# Patient Record
Sex: Male | Born: 1990 | Race: White | Hispanic: No | Marital: Single | Smoking: Current every day smoker
Health system: Southern US, Community
[De-identification: ages and names within clinical notes are randomized; demographics above are authoritative.]

## PROBLEM LIST (undated history)

## (undated) DIAGNOSIS — L409 Psoriasis, unspecified: Secondary | ICD-10-CM

## (undated) DIAGNOSIS — B019 Varicella without complication: Secondary | ICD-10-CM

## (undated) HISTORY — DX: Varicella without complication: B01.9

## (undated) HISTORY — PX: WISDOM TOOTH EXTRACTION: SHX21

## (undated) HISTORY — DX: Psoriasis, unspecified: L40.9

---
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
## 2019-05-27 ENCOUNTER — Other Ambulatory Visit: Payer: Self-pay

## 2019-05-28 ENCOUNTER — Ambulatory Visit: Payer: Self-pay | Admitting: Family Medicine

## 2019-05-28 ENCOUNTER — Encounter: Payer: Self-pay | Admitting: Family Medicine

## 2019-05-28 ENCOUNTER — Ambulatory Visit: Payer: 59 | Admitting: Family Medicine

## 2019-05-28 VITALS — BP 142/98 | HR 81 | Temp 98.0°F | Resp 16 | Ht 73.5 in | Wt 258.0 lb

## 2019-05-28 DIAGNOSIS — R21 Rash and other nonspecific skin eruption: Secondary | ICD-10-CM | POA: Diagnosis not present

## 2019-05-28 DIAGNOSIS — M25571 Pain in right ankle and joints of right foot: Secondary | ICD-10-CM

## 2019-05-28 DIAGNOSIS — M79675 Pain in left toe(s): Secondary | ICD-10-CM | POA: Diagnosis not present

## 2019-05-28 DIAGNOSIS — Z23 Encounter for immunization: Secondary | ICD-10-CM | POA: Diagnosis not present

## 2019-05-28 DIAGNOSIS — Z7689 Persons encountering health services in other specified circumstances: Secondary | ICD-10-CM | POA: Diagnosis not present

## 2019-05-28 DIAGNOSIS — R030 Elevated blood-pressure reading, without diagnosis of hypertension: Secondary | ICD-10-CM | POA: Diagnosis not present

## 2019-05-28 DIAGNOSIS — E669 Obesity, unspecified: Secondary | ICD-10-CM

## 2019-05-28 MED ORDER — TRIAMCINOLONE ACETONIDE 0.1 % EX CREA: 1.0000 "application " | TOPICAL_CREAM | Freq: Two times a day (BID) | CUTANEOUS | 2 refills | Status: DC

## 2019-05-28 NOTE — Progress Notes (Signed)
Patient ID: Ricky Gregory, male  DOB: 1990-08-04, 29 y.o.   MRN: [id number] Patient Care Team    Relationship Specialty Notifications Start End  Ma Hillock, DO PCP - General Family Medicine  05/28/19     Chief Complaint  Patient presents with  . Establish Care    Pt has not had PCP since peds. He used college MD and urgent cares. Pt went to dermatologist in [place] 06/2018 and was dx with Psorasis. Started on stomach. Dermatologust told him once he got insurance he needed oral medication possibly     Subjective:  Samit Sylve is a 29 y.o.  male present for new patient establishment. All past medical history, surgical history, allergies, family history, immunizations, medications and social history were updated in the electronic medical record today. All recent labs, ED visits and hospitalizations within the last year were reviewed.  Patient reports he has been told he has psoriasis.  He has been seen by dermatology- last visit 07/11/2018. [facility]- derm Dr. Delena Bali.  Patient reports they took a slide of his skin and provided him with triamcinolone cream.  He does not believe any biopsy was taken of the skin.  It appears a fungal slide was negative per EMR review.  He states the cream has been helpful but is difficult to apply all over the areas that are causing his discomfort.  His initial area was at his beltline and about the size of a golf ball and currently it stretches along the majority of his beltline.  He states it did improve in the summertime when he lost weight.  He also has a rash on his bilateral forearms and his thighs.  He also has been use Cetaphil cream.  He brings a form with him today concerning lab work surrounding common variable immune deficiency.  He reports his sister was diagnosed with C VID and is encouraging him to have "autoimmune def blood work up."  Patient also complains of right ankle and left toe pain.  He reports his right and left toe intermittently  become tender and swollen and then resolve on their own.  Last week his right ankle started bothering him and his left toe.  He also has had discomfort in his right knee at other times.  Depression screen [facility] 2/9 05/28/2019  Decreased Interest 0  Down, Depressed, Hopeless 0  PHQ - 2 Score 0   No flowsheet data found.      No flowsheet data found.   Immunization History  Administered Date(s) Administered  . Influenza,inj,Quad PF,6+ Mos 05/28/2019    No exam data present  Past Medical History:  Diagnosis Date  . Chicken pox   . Psoriasis    No Known Allergies Past Surgical History:  Procedure Laterality Date  . APPENDECTOMY  2008  . WISDOM TOOTH EXTRACTION     Family History  Problem Relation Age of Onset  . Depression Mother   . Mental illness Mother   . Hypothyroidism Mother   . Iron deficiency Father   . Hypothyroidism Sister   . Immunodeficiency Sister        Common variable immune deficiency  . Heart attack Maternal Grandfather   . Alcohol abuse Paternal Grandfather   . Mental illness Brother    Social History   Social History Narrative   Marital status/children/pets: Single.   Education/employment: Bachelor's degree.  Employed and works in Insurance underwriter.   Safety:      -Wears a bicycle helmet riding a  bike: Yes     -smoke alarm in the home:Yes     - wears seatbelt: Yes     - Feels safe in their relationships: Yes    Allergies as of 05/28/2019   No Known Allergies     Medication List       Accurate as of May 28, 2019 11:59 PM. If you have any questions, ask your nurse or doctor.        triamcinolone cream 0.1 % Commonly known as: KENALOG Apply 1 application topically 2 (two) times daily.       All past medical history, surgical history, allergies, family history, immunizations andmedications were updated in the EMR today and reviewed under the history and medication portions of their EMR.    Recent Results (from the past 2160 hour(s))  CBC  w/Diff     Status: None   Collection Time: 05/28/19  2:50 PM  Result Value Ref Range   WBC 8.0 4.0 - 10.5 K/uL   RBC 4.91 4.22 - 5.81 Mil/uL   Hemoglobin 15.2 13.0 - 17.0 g/dL   HCT 44.8 39.0 - 52.0 %   MCV 91.1 78.0 - 100.0 fl   MCHC 34.0 30.0 - 36.0 g/dL   RDW 13.8 11.5 - 15.5 %   Platelets 282.0 150.0 - 400.0 K/uL   Neutrophils Relative % 67.3 43.0 - 77.0 %   Lymphocytes Relative 25.3 12.0 - 46.0 %   Monocytes Relative 5.3 3.0 - 12.0 %   Eosinophils Relative 1.3 0.0 - 5.0 %   Basophils Relative 0.8 0.0 - 3.0 %   Neutro Abs 5.4 1.4 - 7.7 K/uL   Lymphs Abs 2.0 0.7 - 4.0 K/uL   Monocytes Absolute 0.4 0.1 - 1.0 K/uL   Eosinophils Absolute 0.1 0.0 - 0.7 K/uL   Basophils Absolute 0.1 0.0 - 0.1 K/uL  Comp Met (CMET)     Status: Abnormal   Collection Time: 05/28/19  2:50 PM  Result Value Ref Range   Sodium 138 135 - 145 mEq/L   Potassium 4.0 3.5 - 5.1 mEq/L   Chloride 104 96 - 112 mEq/L   CO2 26 19 - 32 mEq/L   Glucose, Bld 105 (H) 70 - 99 mg/dL   BUN 14 6 - 23 mg/dL   Creatinine, Ser 0.79 0.40 - 1.50 mg/dL   Total Bilirubin 0.8 0.2 - 1.2 mg/dL   Alkaline Phosphatase 43 39 - 117 U/L   AST 16 0 - 37 U/L   ALT 24 0 - 53 U/L   Total Protein 7.2 6.0 - 8.3 g/dL   Albumin 4.7 3.5 - 5.2 g/dL   GFR 115.05 >60.00 mL/min   Calcium 9.8 8.4 - 10.5 mg/dL  Uric acid     Status: None   Collection Time: 05/28/19  2:50 PM  Result Value Ref Range   Uric Acid, Serum 7.6 4.0 - 7.8 mg/dL  ANA, IFA Comprehensive Panel-([facility])     Status: Abnormal   Collection Time: 05/28/19  2:50 PM  Result Value Ref Range   Anti Nuclear Antibody (ANA) POSITIVE (A) NEGATIVE    Comment: ANA IFA is a first line screen for detecting the presence of up to approximately 150 autoantibodies in various autoimmune diseases. A positive ANA IFA result is suggestive of autoimmune disease and reflexes to titer and pattern. Further laboratory testing may be considered if clinically indicated. . For additional  information, please refer to http://education.[facility].com/faq/FAQ177 (This link is being provided for informational/ educational purposes only.) .      ds DNA Ab <1 IU/mL    Comment:                            IU/mL       Interpretation                            < or = 4    Negative                            5-9         Indeterminate                            > or = 10   Positive .    Scleroderma (Scl-70) (ENA) Antibody, IgG <1.0 NEG <1.0 NEG AI   ENA SM Ab Ser-aCnc <1.0 NEG <1.0 NEG AI   SM/RNP <1.0 NEG <1.0 NEG AI   SSA (Ro) (ENA) Antibody, IgG <1.0 NEG <1.0 NEG AI   SSB (La) (ENA) Antibody, IgG <1.0 NEG <1.0 NEG AI  TSH     Status: None   Collection Time: 05/28/19  2:50 PM  Result Value Ref Range   TSH 1.26 0.35 - 4.50 uIU/mL  Anti-nuclear ab-titer (ANA titer)     Status: Abnormal   Collection Time: 05/28/19  2:50 PM  Result Value Ref Range   ANA Titer 1 1:80 (H) titer    Comment: A low level ANA titer may be present in pre-clinical autoimmune diseases and normal individuals.                 Reference Range                 <1:40        Negative                 1:40-1:80    Low Antibody Level                 >1:80        Elevated Antibody Level .    ANA Pattern 1 Nuclear, Speckled (A)     Comment: Speckled pattern is associated with mixed connective tissue disease (MCTD), systemic lupus erythematosus (SLE), Sjogren's syndrome, dermatomyositis, and  systemic sclerosis/polymyositis overlap. . AC-2,4,5,29: Speckled . International Consensus on ANA Patterns (https://doi.org/10.1515/cclm-2018-0052)    ANA TITER 1:80 (H) titer    Comment: A low level ANA titer may be present in pre-clinical autoimmune diseases and normal individuals.                 Reference Range                 <1:40        Negative                 1:40-1:80    Low Antibody Level                 >1:80        Elevated Antibody Level .    ANA PATTERN Nuclear, Homogeneous (A)     Comment:  Homogeneous pattern is associated with systemic lupus erythematosus (SLE), drug-induced lupus and juvenile idiopathic arthritis. . AC-1: Homogeneous . International Consensus on ANA Patterns (https://doi.org/10.1515/cclm-2018-0052)     Patient was never admitted.   ROS: 14 pt   review of systems performed and negative (unless mentioned in an HPI)  Objective: BP (!) 142/98 (BP Location: Left Arm, Patient Position: Sitting, Cuff Size: Large)   Pulse 81   Temp 98 F (36.7 C) (Temporal)   Resp 16   Ht 6' 1.5" (1.867 m)   Wt 258 lb (117 kg)   SpO2 97%   BMI 33.58 kg/m  Gen: Afebrile. No acute distress. Nontoxic in appearance, well-developed, well-nourished, pleasant, Caucasian, obese male. HENT: AT. [place].  Eyes:Pupils Equal Round Reactive to light, Extraocular movements intact,  Conjunctiva without redness, discharge or icterus. CV: RRR no murmur, no edema Chest: CTAB, no wheeze, rhonchi or crackles. Skin: Red flat solid rash along beltline.  Multiple mildly raised less than 1 mm raised red rash bilateral forearms, no purpura or petechiae. Warm and well-perfused. Skin intact. Neuro/Msk:  Normal gait. PERLA. EOMi. Alert. Oriented x3.  No erythema of right ankle or large toe.  No tenderness to palpation today.  Decreased range of motion in flexion and extension. Psych: Normal affect, dress and demeanor. Normal speech. Normal thought content and judgment.   Assessment/plan: Lonie Newsham is a 29 y.o. male present for est. care with multiple acute complaints. Need for influenza vaccination - Flu Vaccine QUAD 6+ mos PF IM (Fluarix Quad PF)  Rash -EMR review it appears he was treated for tinea infection in the past and did not respond to antifungal (nystatin) and then was prescribed triamcinolone cream and told he likely had psoriasis.  The rash on his forearms and the rash on his abdomen are different in characteristic from each other. -Discussed options with him today and refilled his  triamcinolone cream for him.  However, would also like to prescribe clotrimazole cream for him to use in a separate area to test for response on his abdomen.  We will proceed with the autoimmune work-up and check CBC CMP and TSH as well. As far as his sister history of common variable immune deficiency-he has no history of repeat infections.  He has a possible history of psoriasis.  Currently I do not see indication for a full immunodeficiency work-up.  Course, this may change later depending upon the etiology/diagnosis of his rash. -We will refer to dermatology for further evaluation and biopsy.  Patient is agreeable to this approach. - CBC w/Diff - Comp Met (CMET) - ANA, IFA Comprehensive Panel-([facility]) - TSH  Elevated BP without diagnosis of hypertension/obesity Patient is a borderline elevated blood pressure here today.  He does endorse being told of a borderline blood pressure in the past. -Low-sodium diet encouraged - increase exercise to encourage - CBC w/Diff - Comp Met (CMET) - TSH -After lab results if thyroid panel was normal, will have close follow-up on blood pressure within 2 to 4 weeks for recheck and start medication if appropriate.  Avoid diuretic with possible gout.  Pain of toe of left foot/pain of right ankle -His description of symptoms sounds like he potentially could have a gout diagnosis.  We will attempt to check uric acid today although it has been greater than 1 week since onset of symptoms and they are improving. Discussed prevention with him today and low purine diet was advised along with examples provided. - CBC w/Diff - Uric acid -If gout consider allopurinol start.   Return if symptoms worsen or fail to improve. Orders Placed This Encounter  Procedures  . Flu Vaccine QUAD 6+ mos PF IM (Fluarix Quad PF)  . CBC w/Diff  . Comp Met (CMET)  .  Uric acid  . ANA, IFA Comprehensive Panel-([facility])  . TSH  . Anti-nuclear ab-titer (ANA titer)   Meds ordered this  encounter  Medications  . triamcinolone cream (KENALOG) 0.1 %    Sig: Apply 1 application topically 2 (two) times daily.    Dispense:  80 g    Refill:  2     Note is dictated utilizing voice recognition software. Although note has been proof read prior to signing, occasional typographical errors still can be missed. If any questions arise, please do not hesitate to call for verification.  Electronically signed by: Howard Pouch, DO [place]

## 2019-05-28 NOTE — Patient Instructions (Signed)
nice to meet you today.  Start a low purine diet.  Exercise and low sodium.  We we call you with lab results and discuss further plan depending upon those results.      Low-Purine Eating Plan A low-purine eating plan involves making food choices to limit your intake of purine. Purine is a kind of uric acid. Too much uric acid in your blood can cause certain conditions, such as gout and kidney stones. Eating a low-purine diet can help control these conditions. What are tips for following this plan? Reading food labels   Avoid foods with saturated or Trans fat.  Check the ingredient list of grains-based foods, such as bread and cereal, to make sure that they contain whole grains.  Check the ingredient list of sauces or soups to make sure they do not contain meat or fish.  When choosing soft drinks, check the ingredient list to make sure they do not contain high-fructose corn syrup. Shopping  Buy plenty of fresh fruits and vegetables.  Avoid buying canned or fresh fish.  Buy dairy products labeled as low-fat or nonfat.  Avoid buying premade or processed foods. These foods are often high in fat, salt (sodium), and added sugar. Cooking  Use olive oil instead of butter when cooking. Oils like olive oil, canola oil, and sunflower oil contain healthy fats. Meal planning  Learn which foods do or do not affect you. If you find out that a food tends to cause your gout symptoms to flare up, avoid eating that food. You can enjoy foods that do not cause problems. If you have any questions about a food item, talk with your dietitian or health care provider.  Limit foods high in fat, especially saturated fat. Fat makes it harder for your body to get rid of uric acid.  Choose foods that are lower in fat and are lean sources of protein. General guidelines  Limit alcohol intake to no more than 1 drink a day for nonpregnant women and 2 drinks a day for men. One drink equals 12 oz of beer, 5 oz  of wine, or 1 oz of hard liquor. Alcohol can affect the way your body gets rid of uric acid.  Drink plenty of water to keep your urine clear or pale yellow. Fluids can help remove uric acid from your body.  If directed by your health care provider, take a vitamin C supplement.  Work with your health care provider and dietitian to develop a plan to achieve or maintain a healthy weight. Losing weight can help reduce uric acid in your blood. What foods are recommended? The items listed may not be a complete list. Talk with your dietitian about what dietary choices are best for you. Foods low in purines Foods low in purines do not need to be limited. These include:  All fruits.  All low-purine vegetables, pickles, and olives.  Breads, pasta, rice, cornbread, and popcorn. Cake and other baked goods.  All dairy foods.  Eggs, nuts, and nut butters.  Spices and condiments, such as salt, herbs, and vinegar.  Plant oils, butter, and margarine.  Water, sugar-free soft drinks, tea, coffee, and cocoa.  Vegetable-based soups, broths, sauces, and gravies. Foods moderate in purines Foods moderate in purines should be limited to the amounts listed.   cup of asparagus, cauliflower, spinach, mushrooms, or green peas, each day.  2/3 cup uncooked oatmeal, each day.   cup dry wheat bran or wheat germ, each day.  2-3 ounces of meat  or poultry, each day.  4-6 ounces of shellfish, such as crab, lobster, oysters, or shrimp, each day.  1 cup cooked beans, peas, or lentils, each day.  Soup, broths, or bouillon made from meat or fish. Limit these foods as much as possible. What foods are not recommended? The items listed may not be a complete list. Talk with your dietitian about what dietary choices are best for you. Limit your intake of foods high in purines, including:  Beer and other alcohol.  Meat-based gravy or sauce.  Canned or fresh fish, such as: ? Anchovies, sardines, herring,  and tuna. ? Mussels and scallops. ? Codfish, trout, and haddock.  Berniece Salines.  Organ meats, such as: ? Liver or kidney. ? Tripe. ? Sweetbreads (thymus gland or pancreas).  Wild Clinical biochemist.  Yeast or yeast extract supplements.  Drinks sweetened with high-fructose corn syrup. Summary  Eating a low-purine diet can help control conditions caused by too much uric acid in the body, such as gout or kidney stones.  Choose low-purine foods, limit alcohol, and limit foods high in fat.  You will learn over time which foods do or do not affect you. If you find out that a food tends to cause your gout symptoms to flare up, avoid eating that food. This information is not intended to replace advice given to you by your health care provider. Make sure you discuss any questions you have with your health care provider. Document Revised: 04/21/2017 Document Reviewed: 06/22/2016 Elsevier Patient Education  2020 [employer].

## 2019-05-29 LAB — COMPREHENSIVE METABOLIC PANEL
ALT: 24 U/L (ref 0–53)
AST: 16 U/L (ref 0–37)
Albumin: 4.7 g/dL (ref 3.5–5.2)
Alkaline Phosphatase: 43 U/L (ref 39–117)
BUN: 14 mg/dL (ref 6–23)
CO2: 26 mEq/L (ref 19–32)
Calcium: 9.8 mg/dL (ref 8.4–10.5)
Chloride: 104 mEq/L (ref 96–112)
Creatinine, Ser: 0.79 mg/dL (ref 0.40–1.50)
GFR: 115.05 mL/min (ref >60.00)
Glucose, Bld: 105 mg/dL — ABNORMAL HIGH (ref 70–99)
Potassium: 4 mEq/L (ref 3.5–5.1)
Sodium: 138 mEq/L (ref 135–145)
Total Bilirubin: 0.8 mg/dL (ref 0.2–1.2)
Total Protein: 7.2 g/dL (ref 6.0–8.3)

## 2019-05-29 LAB — CBC WITH DIFFERENTIAL/PLATELET
Basophils Absolute: 0.1 10*3/uL (ref 0.0–0.1)
Basophils Relative: 0.8 % (ref 0.0–3.0)
Eosinophils Absolute: 0.1 10*3/uL (ref 0.0–0.7)
Eosinophils Relative: 1.3 % (ref 0.0–5.0)
HCT: 44.8 % (ref 39.0–52.0)
Hemoglobin: 15.2 g/dL (ref 13.0–17.0)
Lymphocytes Relative: 25.3 % (ref 12.0–46.0)
Lymphs Abs: 2 10*3/uL (ref 0.7–4.0)
MCHC: 34 g/dL (ref 30.0–36.0)
MCV: 91.1 fl (ref 78.0–100.0)
Monocytes Absolute: 0.4 10*3/uL (ref 0.1–1.0)
Monocytes Relative: 5.3 % (ref 3.0–12.0)
Neutro Abs: 5.4 10*3/uL (ref 1.4–7.7)
Neutrophils Relative %: 67.3 % (ref 43.0–77.0)
Platelets: 282 10*3/uL (ref 150.0–400.0)
RBC: 4.91 Mil/uL (ref 4.22–5.81)
RDW: 13.8 % (ref 11.5–15.5)
WBC: 8 10*3/uL (ref 4.0–10.5)

## 2019-05-29 LAB — TSH: TSH: 1.26 u[IU]/mL (ref 0.35–4.50)

## 2019-05-29 LAB — URIC ACID: Uric Acid, Serum: 7.6 mg/dL (ref 4.0–7.8)

## 2019-05-30 ENCOUNTER — Telehealth: Payer: Self-pay | Admitting: Family Medicine

## 2019-05-30 ENCOUNTER — Encounter: Payer: Self-pay | Admitting: Family Medicine

## 2019-05-30 DIAGNOSIS — E669 Obesity, unspecified: Secondary | ICD-10-CM | POA: Insufficient documentation

## 2019-05-30 DIAGNOSIS — M79675 Pain in left toe(s): Secondary | ICD-10-CM | POA: Insufficient documentation

## 2019-05-30 DIAGNOSIS — R21 Rash and other nonspecific skin eruption: Secondary | ICD-10-CM | POA: Insufficient documentation

## 2019-05-30 DIAGNOSIS — I10 Essential (primary) hypertension: Secondary | ICD-10-CM | POA: Insufficient documentation

## 2019-05-30 DIAGNOSIS — M2559 Pain in other specified joint: Secondary | ICD-10-CM

## 2019-05-30 LAB — ANA, IFA COMPREHENSIVE PANEL
Anti Nuclear Antibody (ANA): POSITIVE — AB
ENA SM Ab Ser-aCnc: <1 AI
SM/RNP: <1 AI
SSA (Ro) (ENA) Antibody, IgG: <1 AI
SSB (La) (ENA) Antibody, IgG: <1 AI
Scleroderma (Scl-70) (ENA) Antibody, IgG: <1 AI
ds DNA Ab: <1 IU/mL

## 2019-05-30 LAB — ANTI-NUCLEAR AB-TITER (ANA TITER)
ANA TITER: 1:80 {titer} — ABNORMAL HIGH
ANA Titer 1: 1:80 {titer} — ABNORMAL HIGH

## 2019-05-30 MED ORDER — CLOTRIMAZOLE 1 % EX CREA: 1.0000 "application " | TOPICAL_CREAM | Freq: Two times a day (BID) | CUTANEOUS | 0 refills | Status: DC

## 2019-05-30 NOTE — Telephone Encounter (Signed)
Pt was called and given information and instructions. He states that his sisters insurance will actually pay for him to get tested. He is going to speak with the company who does the testing and find out how this works. He did not want to make 2 week F/U appt at this time because he was not at home. He will call back. He verbalized understanding

## 2019-05-30 NOTE — Telephone Encounter (Signed)
Please call patient: -His liver, kidney, electrolytes, blood cell counts and thyroid are all normal levels. -His uric acid/gout screening was at the high end but in normal range.  It is very well possible his symptoms a week prior could have been from gout.  I would encourage him to follow-up as soon as this flares in the future.  In the meantime focus on a low purine diet. -His autoimmune screening is very weakly positive.  The low degree of positivity  is seen in people without autoimmune disorders-therefore the significance of his results are not as likely to be playing a role in his rash. - That being said I did place a referral to dermatology for further evaluation of his rash and consider biopsy so that he may have a definitive diagnosis.  He should be receiving a call from them to schedule. -I also called in a second type of cream after his visit.  The first cream was triamcinolone and is a steroid cream which he had been using.  I would like him to continue the steroid cream on his forearm rash only for now.  I would like him to try the new cream Chlortrimazole on his abdomen for 2 weeks and see if he improves his skin condition better than the steroid cream.  If he is not seeing improvement with the new cream in 2 weeks he can return back to using the steroid cream.        -Reason being-when I reviewed the records I could see in the EMR it appears that he was tried on an antifungal Prior to being diagnosed with psoriasis.  The antifungal prescribed at that time does not always work well on fungal infections such as tinea.  The new cream I called in works on tinea better.  -Lastly on the papers that he provided on genetic testing secondary to his sister having common variable immune deficiency>>> He does not have any medically valid reason to be tested at this time.  Therefore there is no diagnostic code  I could attest to to submit to insurance.  If he desires to be tested anyway for his sisters  benefit, he could still be tested but pay out-of-pocket> which would not need a doctor signature. It is also a saliva sample, which is not something we would be able to do at our particular lab.  Follow-up in 2 weeks to reevaluate rash and his elevated blood pressure-we can review all labs in detail at that time and discuss in person.

## 2019-06-17 NOTE — Telephone Encounter (Signed)
Patient's sister called in requesting Dr. Claiborne Billings to sign the [employer] form for genetic testing. Patient's sister is a patient at [facility] undergoing genetic testing which she has tested positive to a genetic disorder. Unable to give sister any information. She is not listing on DPR.

## 2019-06-18 ENCOUNTER — Telehealth: Payer: Self-pay | Admitting: Family Medicine

## 2019-06-18 ENCOUNTER — Other Ambulatory Visit: Payer: Self-pay

## 2019-06-18 ENCOUNTER — Encounter: Payer: Self-pay | Admitting: Family Medicine

## 2019-06-18 ENCOUNTER — Ambulatory Visit: Payer: 59 | Admitting: Family Medicine

## 2019-06-18 VITALS — BP 158/86 | HR 87 | Temp 98.0°F | Resp 16 | Ht 74.0 in | Wt 261.1 lb

## 2019-06-18 DIAGNOSIS — I10 Essential (primary) hypertension: Secondary | ICD-10-CM | POA: Diagnosis not present

## 2019-06-18 DIAGNOSIS — R21 Rash and other nonspecific skin eruption: Secondary | ICD-10-CM | POA: Diagnosis not present

## 2019-06-18 DIAGNOSIS — E669 Obesity, unspecified: Secondary | ICD-10-CM

## 2019-06-18 MED ORDER — DICLOFENAC SODIUM 75 MG PO TBEC: 75.0000 mg | DELAYED_RELEASE_TABLET | Freq: Two times a day (BID) | ORAL | 5 refills | Status: AC

## 2019-06-18 MED ORDER — LISINOPRIL 5 MG PO TABS: 5.0000 mg | ORAL_TABLET | Freq: Every day | ORAL | 0 refills | Status: AC

## 2019-06-18 NOTE — Patient Instructions (Signed)
Start lisinopril daily for blood pressure.  Start dicolfenac- Take daily when not having flares and every 12 hours with flares. If needing routinely every 12 hours that is ok too.     Psoriasis Psoriasis is a long-term (chronic) skin condition. It occurs because your body's defense system (immune system) causes skin cells to form too quickly. This causes raised, red patches (plaques) on your skin that look silvery. The patches may be on all areas of your body. They can be any size or shape. Psoriasis can come and go. It can range from mild to very bad. It cannot be passed from one person to another (is not contagious). There is no cure for this condition, but it can be helped with treatment. What are the causes? The cause of psoriasis is not known. Some things can make it worse. These are:  Skin damage, such as cuts, scrapes, sunburn, and dryness.  Not getting enough sunlight.  Some medicines.  Alcohol.  Tobacco.  Stress.  Infections. What increases the risk?  Having a family member with psoriasis.  Being very overweight (obese).  Being 54-57 years old.  Taking certain medicines. What are the signs or symptoms? There are different types of psoriasis. The types are:  Plaque. This is the most common. Symptoms include red, raised patches with a silvery coating. These may be itchy. Your nails may be crumbly or fall off.  Guttate. Symptoms include small red spots on your stomach area, arms, and legs. These may happen after you have been sick, such as with strep throat.  Inverse. Symptoms include patches in your armpits, under your breasts, private areas, or on your butt.  Pustular. Symptoms include pus-filled bumps on the palms of your hands or the soles of your feet. You also may feel very tired, weak, have a fever, and not be hungry.  Erythrodermic. Symptoms include bright red skin that looks burned. You may have a fast heartbeat and a body temperature that is too high or too  low. You may be itchy or in pain.  Sebopsoriasis. Symptoms include red patches on your scalp, forehead, and face that are greasy.  Psoriatic arthritis. Symptoms include swollen, painful joints along with scaly skin patches. How is this treated? There is no cure for this condition, but treatment can:  Help your skin heal.  Lessen itching and irritation and swelling (inflammation).  Slow the growth of new skin cells.  Help your body's defense system respond better to your skin. Treatment may include:  Creams or ointments.  Light therapy. This may include natural sunlight or light therapy in a doctor's office.  Medicines. These can help your body better manage skin cells. They may be used with light therapy or ointments. Medicines may include pills or injections. You may also get antibiotic medicines if you have an infection. Follow these instructions at home: Skin Care  Apply lotion to your skin as needed. Only use those that your doctor has said are okay.  Apply cool, wet cloths (cold compresses) to the affected areas.  Do not use a hot tub or take hot showers. Use slightly warm, not hot, water when taking showers and baths.  Do not scratch your skin. Lifestyle   Do not use any products that contain nicotine or tobacco, such as cigarettes, e-cigarettes, and chewing tobacco. If you need help quitting, ask your doctor.  Lower your stress.  Keep a healthy weight.  Go out in the sun as told by your doctor. Do not get sunburned.  Join a support group. Medicines  Take or use over-the-counter and prescription medicines only as told by your doctor.  If you were prescribed an antibiotic medicine, take it as told by your doctor. Do not stop using the antibiotic even if you start to feel better. Alcohol use If you drink alcohol:  Limit how much you use: ? 0-1 drink a day for women. ? 0-2 drinks a day for men.  Be aware of how much alcohol is in your drink. In the [place] one  drink equals one 12 oz bottle of beer (355 mL), one 5 oz glass of wine (148 mL), or one 1 oz glass of hard liquor (44 mL). General instructions  Keep a journal to track the things that cause symptoms (triggers). Try to avoid these things.  See a counselor if you feel the support would help.  Keep all follow-up visits as told by your doctor. This is important. Contact a doctor if:  You have a fever.  Your pain gets worse.  You have more redness or warmth in the affected areas.  You have new or worse pain or stiffness in your joints.  Your nails start to break easily or pull away from the nail bed.  You feel very sad (depressed). Summary  Psoriasis is a long-term (chronic) skin condition.  There is no cure for this condition, but treatment can help manage it.  Keep a journal to track the things that cause symptoms.  Take or use over-the-counter and prescription medicines only as told by your doctor.  Keep all follow-up visits as told by your doctor. This is important. This information is not intended to replace advice given to you by your health care provider. Make sure you discuss any questions you have with your health care provider. Document Revised: 03/13/2018 Document Reviewed: 03/13/2018 Elsevier Patient Education  [employer].    Psoriatic Arthritis Psoriatic arthritis is a long-term (chronic) condition that causes pain, swelling, and stiffness in the joints. The large joints of the legs, hips, and pelvis are most often affected. The joints in the neck and back may also be affected. Sometimes psoriatic arthritis can involve joints in the toes, fingers, wrists, and elbows. Most people with psoriatic arthritis have a chronic skin disease that causes itchy scales and patches to form on the skin (psoriasis) before they develop psoriatic arthritis. In some cases, a person may have psoriatic arthritis before or without having psoriasis. Psoriatic arthritis can be mild  or severe. It may come and go or cause symptoms all the time. It may affect one joint, a few joints, or many joints. In severe cases, untreated psoriatic arthritis can cause joint damage. What are the causes? The exact cause of this condition is not known. Psoriatic arthritis is an autoimmune disease. With this type of disease, the body's defense system (immune system) mistakenly attacks healthy tissues. If you have psoriasis, the immune system attacks the skin. With psoriatic arthritis, the immune system attacks joints and the tissues that connect muscles to joints (tendons). The disease may be activated by a trigger, such as an infection or stress. What increases the risk? You are more likely to develop this condition if:  You have psoriasis. This is the biggest risk factor. Most people who develop psoriatic arthritis have had psoriasis for 5 to 10 years.  You have a family history of psoriasis or psoriatic arthritis.  You are between the ages of 79 and 87. What are the signs or symptoms? The main  symptom of this condition is inflammation of joints and tendons. Other symptoms may include:  Joint swelling.  Joint pain.  Joint stiffness, especially in the morning.  Swollen fingers and toes.  Pain in areas where tendons connect to bones.  Pain in the heel or sole of the foot.  Pitted and weak nails.  Tiredness (fatigue).  Eye redness. How is this diagnosed? This condition may be diagnosed based on:  Your symptoms and medical history.  A physical exam.  X-rays to look for joint inflammation or damage, especially in the joints of the pelvis (sacroiliac joints).  Other imaging tests, such as a CT scan or MRI.  Blood tests to look for inflammation and to rule out other causes of joint inflammation, such as gout or rheumatoid arthritis. How is this treated? The goal of treatment is to reduce pain and inflammation and protect joints from damage. Treatment depends on how severe  the inflammation is and how many joints are affected. Treatment may include:  Medicines, such as: ? NSAIDs to relieve pain and inflammation. For people with mild disease, these may be the only medicines needed. ? Disease-modifying antirheumatic drugs (DMARDs). ? Biologic medicines. These may be used for severe inflammation or if other medicines are not working. These medicines are usually given as injections or through an IV. They are very effective for many people with inflammatory arthritis, but there is an increased risk of infection.  Physical therapy and other exercise to strengthen muscles that support joints and to prevent joint stiffness.  A brace or splint to support a painful and swollen joint.  Surgery to reconstruct or replace a joint. This may be an option for people with severe joint damage if other treatments have not helped. Follow these instructions at home: Medicines  Take over-the-counter and prescription medicines only as told by your health care provider.  Be aware of the possible side effects of your medicine and know when to call your health care provider.  If you are taking a biologic, let your health care provider know if you have signs or symptoms of an infection. You may need to stop treatment until your infection clears up. Managing pain, stiffness, and swelling   If directed, put ice on painful areas. ? Put ice in a plastic bag. ? Place a towel between your skin and the bag. ? Leave the ice on for 20 minutes, 2-3 times a day. If you have a brace or splint:  Wear the brace or splint as told by your health care provider. Remove it only as told by your health care provider.  Loosen the brace or splint if your fingers or toes tingle, become numb, or turn cold and blue.  Keep the brace or splint clean.  If the brace or splint is not waterproof: ? Do not let it get wet. ? Cover it with a watertight covering when you take a bath or  shower. Activity  Return to your normal activities as told by your health care provider. Ask your health care provider what activities are safe for you.  Get regular exercise. Ask your health care provider what type of exercise is best for you. Your health care provider may recommend: ? Low-impact exercises such as walking, biking, or swimming. ? Exercises that include stretching, such as tai chi and yoga.  Do not exercise when you have a flare of symptoms. Rest until the symptoms improve. Eating and drinking   Do not drink alcohol if you are taking an  NSAID. Alcohol and NSAIDs can cause stomach irritation.  Eat a healthy diet that includes plenty of vegetables, fruits, whole grains, low-fat dairy products, and lean protein. Do not eat a lot of foods that are high in solid fats, added sugars, or salt. General instructions  Do not use any products that contain nicotine or tobacco, such as cigarettes, e-cigarettes, and chewing tobacco. These can make arthritis worse. If you need help quitting, ask your health care provider.  Maintain a healthy weight. A healthy weight will help you stay active and take stress off your joints.  Stay up to date on all immunizations, including the yearly (annual) flu vaccine.  Keep all follow-up visits as told by your health care provider. This is important. Where to find more information  American Academy of Dermatology: http://jones-macias.info/  American College of Rheumatology: www.rheumatology.[place]: www.psoriasis.org Contact a health care provider if:  Your signs and symptoms flare up.  You have side effects from your medicines.  You are taking a biologic and you have a fever or other signs of infection, such as: ? Chills. ? Feeling tired. ? Cough or sore throat. ? Loss of appetite. Summary  Psoriatic arthritis is an autoimmune disease that causes joint pain, swelling, and stiffness.  Most people with psoriatic arthritis  have the skin disease called psoriasis first.  You may have imaging tests and blood tests to help your health care provider diagnose this condition.  The goal of treatment is to reduce pain and inflammation and protect joints from damage.  Medicines can relieve symptoms and prevent joint damage. This information is not intended to replace advice given to you by your health care provider. Make sure you discuss any questions you have with your health care provider. Document Revised: 09/04/2018 Document Reviewed: 01/11/2018 Elsevier Patient Education  2020 [employer].

## 2019-06-18 NOTE — Telephone Encounter (Signed)
Pts sister was called and told he could certainly bring form and speak with Dr Claiborne Billings about it but that would need to be a conversation that happens between him and Dr Claiborne Billings as she is not on his DPR. She verbalized understanding. Sister stated that there did not need to be a dx code, all results would go to her [facility] physicians to read/review, it just needed his PCP signature.

## 2019-06-18 NOTE — Telephone Encounter (Signed)
Called sister Aundra Millet) to advise that we (Dr Claiborne Billings) cannot sign a blank order for a test she cannot preform and will not get results to. Advised sister that she may want o have her own PCP or treating MD at [facility] to sign.

## 2019-06-18 NOTE — Progress Notes (Signed)
   Patient ID: Ricky Gregory, male  DOB: 05/23/1990, 29 y.o.   MRN: [id number] Patient Care Team    Relationship Specialty Notifications Start End  Kuneff, Renee A, DO PCP - General Family Medicine  05/28/19     Chief Complaint  Patient presents with  . Follow-up    Pt states the new medication that is being placed on his abd has not gotten better and has gotten a little worse. The old cream he was using, he used all over and its almost cleared up    Subjective:  Ricky Gregory is a 29 y.o.  male present for follow-up on elevated blood pressure and skin condition. Rash/joint pain: Rash responded well to the triamcinolone cream per patient.  Antifungal cream was not helpful.  He has yet to hear back from the dermatology referral placed approximately 2 and half weeks ago.  He again is having some inflammatory aspects to his right ankle is waxing and waning.  His uric acid levels were low.  He had a very low positive ANA titer. Prior note:  Patient reports he has been told he has psoriasis.  He has been seen by dermatology- last visit 07/11/2018. [facility]- derm Dr. Sturgill.  Patient reports they took a slide of his skin and provided him with triamcinolone cream.  He does not believe any biopsy was taken of the skin.  It appears a fungal slide was negative per EMR review.  He states the cream has been helpful but is difficult to apply all over the areas that are causing his discomfort.  His initial area was at his beltline and about the size of a golf ball and currently it stretches along the majority of his beltline.  He states it did improve in the summertime when he lost weight.  He also has a rash on his bilateral forearms and his thighs.  He also has been use Cetaphil cream. Patient also complains of right ankle and left toe pain.  He reports his right and left toe intermittently become tender and swollen and then resolve on their own.  Last week his right ankle started bothering him and his  left toe.  He also has had discomfort in his right knee at other times.    Depression screen PHQ 2/9 05/28/2019  Decreased Interest 0  Down, Depressed, Hopeless 0  PHQ - 2 Score 0   No flowsheet data found.      No flowsheet data found.   Immunization History  Administered Date(s) Administered  . Influenza,inj,Quad PF,6+ Mos 05/28/2019    No exam data present  Past Medical History:  Diagnosis Date  . Chicken pox   . Psoriasis    No Known Allergies Past Surgical History:  Procedure Laterality Date  . APPENDECTOMY  2008  . WISDOM TOOTH EXTRACTION     Family History  Problem Relation Age of Onset  . Depression Mother   . Mental illness Mother   . Hypothyroidism Mother   . Iron deficiency Father   . Hypothyroidism Sister   . Immunodeficiency Sister        Common variable immune deficiency  . Heart attack Maternal Grandfather   . Alcohol abuse Paternal Grandfather   . Mental illness Brother    Social History   Social History Narrative   Marital status/children/pets: Single.   Education/employment: Bachelor's degree.  Employed and works in insurance.   Safety:      -Wears a bicycle helmet riding a bike: Yes     -  smoke alarm in the home:Yes     - wears seatbelt: Yes     - Feels safe in their relationships: Yes    Allergies as of 06/18/2019   No Known Allergies     Medication List       Accurate as of June 18, 2019 11:59 PM. If you have any questions, ask your nurse or doctor.        STOP taking these medications   clotrimazole 1 % cream Commonly known as: Clotrimazole AF Stopped by: Renee Kuneff, DO     TAKE these medications   diclofenac 75 MG EC tablet Commonly known as: VOLTAREN Take 1 tablet (75 mg total) by mouth 2 (two) times daily. Started by: Renee Kuneff, DO   lisinopril 5 MG tablet Commonly known as: ZESTRIL Take 1 tablet (5 mg total) by mouth daily. Started by: Renee Kuneff, DO   triamcinolone cream 0.1 % Commonly known as:  KENALOG Apply 1 application topically 2 (two) times daily.       All past medical history, surgical history, allergies, family history, immunizations andmedications were updated in the EMR today and reviewed under the history and medication portions of their EMR.    Recent Results (from the past 2160 hour(s))  CBC w/Diff     Status: None   Collection Time: 05/28/19  2:50 PM  Result Value Ref Range   WBC 8.0 4.0 - 10.5 K/uL   RBC 4.91 4.22 - 5.81 Mil/uL   Hemoglobin 15.2 13.0 - 17.0 g/dL   HCT 44.8 39.0 - 52.0 %   MCV 91.1 78.0 - 100.0 fl   MCHC 34.0 30.0 - 36.0 g/dL   RDW 13.8 11.5 - 15.5 %   Platelets 282.0 150.0 - 400.0 K/uL   Neutrophils Relative % 67.3 43.0 - 77.0 %   Lymphocytes Relative 25.3 12.0 - 46.0 %   Monocytes Relative 5.3 3.0 - 12.0 %   Eosinophils Relative 1.3 0.0 - 5.0 %   Basophils Relative 0.8 0.0 - 3.0 %   Neutro Abs 5.4 1.4 - 7.7 K/uL   Lymphs Abs 2.0 0.7 - 4.0 K/uL   Monocytes Absolute 0.4 0.1 - 1.0 K/uL   Eosinophils Absolute 0.1 0.0 - 0.7 K/uL   Basophils Absolute 0.1 0.0 - 0.1 K/uL  Comp Met (CMET)     Status: Abnormal   Collection Time: 05/28/19  2:50 PM  Result Value Ref Range   Sodium 138 135 - 145 mEq/L   Potassium 4.0 3.5 - 5.1 mEq/L   Chloride 104 96 - 112 mEq/L   CO2 26 19 - 32 mEq/L   Glucose, Bld 105 (H) 70 - 99 mg/dL   BUN 14 6 - 23 mg/dL   Creatinine, Ser 0.79 0.40 - 1.50 mg/dL   Total Bilirubin 0.8 0.2 - 1.2 mg/dL   Alkaline Phosphatase 43 39 - 117 U/L   AST 16 0 - 37 U/L   ALT 24 0 - 53 U/L   Total Protein 7.2 6.0 - 8.3 g/dL   Albumin 4.7 3.5 - 5.2 g/dL   GFR 115.05 >60.00 mL/min   Calcium 9.8 8.4 - 10.5 mg/dL  Uric acid     Status: None   Collection Time: 05/28/19  2:50 PM  Result Value Ref Range   Uric Acid, Serum 7.6 4.0 - 7.8 mg/dL  ANA, IFA Comprehensive Panel-([facility])     Status: Abnormal   Collection Time: 05/28/19  2:50 PM  Result Value Ref Range   Anti Nuclear Antibody (  ANA) POSITIVE (A) NEGATIVE    Comment: ANA IFA  is a first line screen for detecting the presence of up to approximately 150 autoantibodies in various autoimmune diseases. A positive ANA IFA result is suggestive of autoimmune disease and reflexes to titer and pattern. Further laboratory testing may be considered if clinically indicated. . For additional information, please refer to http://education.[facility].com/faq/FAQ177 (This link is being provided for informational/ educational purposes only.) .    ds DNA Ab <1 IU/mL    Comment:                            IU/mL       Interpretation                            < or = 4    Negative                            5-9         Indeterminate                            > or = 10   Positive .    Scleroderma (Scl-70) (ENA) Antibody, IgG <1.0 NEG <1.0 NEG AI   ENA SM Ab Ser-aCnc <1.0 NEG <1.0 NEG AI   SM/RNP <1.0 NEG <1.0 NEG AI   SSA (Ro) (ENA) Antibody, IgG <1.0 NEG <1.0 NEG AI   SSB (La) (ENA) Antibody, IgG <1.0 NEG <1.0 NEG AI  TSH     Status: None   Collection Time: 05/28/19  2:50 PM  Result Value Ref Range   TSH 1.26 0.35 - 4.50 uIU/mL  Anti-nuclear ab-titer (ANA titer)     Status: Abnormal   Collection Time: 05/28/19  2:50 PM  Result Value Ref Range   ANA Titer 1 1:80 (H) titer    Comment: A low level ANA titer may be present in pre-clinical autoimmune diseases and normal individuals.                 Reference Range                 <1:40        Negative                 1:40-1:80    Low Antibody Level                 >1:80        Elevated Antibody Level .    ANA Pattern 1 Nuclear, Speckled (A)     Comment: Speckled pattern is associated with mixed connective tissue disease (MCTD), systemic lupus erythematosus (SLE), Sjogren's syndrome, dermatomyositis, and  systemic sclerosis/polymyositis overlap. . AC-2,4,5,29: Speckled . International Consensus on ANA Patterns (https://www.hernandez-brewer.com/)    ANA TITER 1:80 (H) titer    Comment: A low level ANA  titer may be present in pre-clinical autoimmune diseases and normal individuals.                 Reference Range                 <1:40        Negative                 1:40-1:80    Low Antibody Level                 >  1:80        Elevated Antibody Level .    ANA PATTERN Nuclear, Homogeneous (A)     Comment: Homogeneous pattern is associated with systemic lupus erythematosus (SLE), drug-induced lupus and juvenile idiopathic arthritis. . AC-1: Homogeneous . International Consensus on ANA Patterns (https://doi.org/10.1515/cclm-2018-0052)     Patient was never admitted.   ROS: 14 pt review of systems performed and negative (unless mentioned in an HPI)  Objective: BP (!) 158/86 (BP Location: Left Arm, Patient Position: Sitting, Cuff Size: Normal)   Pulse 87   Temp 98 F (36.7 C) (Temporal)   Resp 16   Ht 6' 2" (1.88 m)   Wt 261 lb 2 oz (118.4 kg)   SpO2 98%   BMI 33.53 kg/m  Gen: Afebrile. No acute distress.  HENT: AT. [place].  Eyes:Pupils Equal Round Reactive to light, Extraocular movements intact,  Conjunctiva without redness, discharge or icterus. CV: RRR no murmur, no edema, +2/4 P posterior tibialis pulses Chest: CTAB, no wheeze or crackles Skin: Patchy skin rashes have improved, except for area over midline/beltline seems to remain the same, no purpura or petechiae.  Neuro:  Normal gait. PERLA. EOMi. Alert. Oriented. Psych: Normal affect, dress and demeanor. Normal speech. Normal thought content and judgment..   Assessment/plan: Graig Forgette is a 29 y.o. male present for  Rash/joint pain -Patient with likely psoriasis in condition.  Referral to dermatology has been placed.  We will check on the status of this referral for him he has not heard back from them yet.  His joint pain likely could be related to his skin condition if it does prove to be psoriasis.  He had a weakly positive ANA. -Discussed starting a daily anti-inflammatory to possibly help with his discomfort and  he is agreeable to this today.  Diclofenac prescribed. -Triamcinolone cream refilled for him. -Hopefully he gets into Derm quickly for formal evaluation and diagnosis.  hypertension/obesity Patient with elevated blood pressures again today.  Discussed medication and he is agreeable to start lisinopril 5 mg daily. -Low-sodium diet encouraged - increase exercise to encourage - CBC w/Diff-normal - Comp Met (CMET)-normal - TSH-normal -Follow-up in 2 months.  Once pressures are established to be controlled on a regimen will follow up every 6 months.    Return in about 2 months (around 08/16/2019). No orders of the defined types were placed in this encounter.  Meds ordered this encounter  Medications  . lisinopril (ZESTRIL) 5 MG tablet    Sig: Take 1 tablet (5 mg total) by mouth daily.    Dispense:  90 tablet    Refill:  0  . diclofenac (VOLTAREN) 75 MG EC tablet    Sig: Take 1 tablet (75 mg total) by mouth 2 (two) times daily.    Dispense:  60 tablet    Refill:  5  . triamcinolone cream (KENALOG) 0.1 %    Sig: Apply 1 application topically 2 (two) times daily.    Dispense:  80 g    Refill:  2     Note is dictated utilizing voice recognition software. Although note has been proof read prior to signing, occasional typographical errors still can be missed. If any questions arise, please do not hesitate to call for verification.  Electronically signed by: Renee Kuneff, DO [facility]  

## 2019-06-20 ENCOUNTER — Encounter: Payer: Self-pay | Admitting: Family Medicine

## 2019-06-20 MED ORDER — TRIAMCINOLONE ACETONIDE 0.1 % EX CREA: 1.0000 "application " | TOPICAL_CREAM | Freq: Two times a day (BID) | CUTANEOUS | 2 refills | Status: AC

## 2019-08-19 ENCOUNTER — Ambulatory Visit: Payer: 59

## 2019-08-19 ENCOUNTER — Ambulatory Visit: Payer: 59 | Admitting: Family Medicine

## 2019-08-19 ENCOUNTER — Telehealth: Payer: Self-pay

## 2019-08-19 ENCOUNTER — Other Ambulatory Visit: Payer: Self-pay

## 2019-08-19 ENCOUNTER — Encounter: Payer: Self-pay | Admitting: Family Medicine

## 2019-08-19 VITALS — BP 137/87 | HR 103 | Temp 98.2°F | Resp 18 | Ht 74.0 in

## 2019-08-19 DIAGNOSIS — M7989 Other specified soft tissue disorders: Secondary | ICD-10-CM | POA: Diagnosis not present

## 2019-08-19 DIAGNOSIS — M79604 Pain in right leg: Secondary | ICD-10-CM | POA: Diagnosis not present

## 2019-08-19 DIAGNOSIS — R000 Tachycardia, unspecified: Secondary | ICD-10-CM

## 2019-08-19 MED ORDER — PREDNISONE 20 MG PO TABS: ORAL_TABLET | ORAL | 0 refills | Status: DC

## 2019-08-19 MED ORDER — METHYLPREDNISOLONE ACETATE 80 MG/ML IJ SUSP
80.0000 mg | Freq: Once | INTRAMUSCULAR | Status: AC
  Administered 2019-08-19: 14:00:00 80 mg via INTRAMUSCULAR

## 2019-08-19 MED ORDER — NAPROXEN 500 MG PO TABS: 500.0000 mg | ORAL_TABLET | Freq: Two times a day (BID) | ORAL | 0 refills | Status: DC

## 2019-08-19 NOTE — Telephone Encounter (Signed)
Patient's right ankle is red/swollen/painful. Patient has a friend that can drive him here this afternoon. Patient is requesting a same day appointment.

## 2019-08-19 NOTE — Progress Notes (Signed)
This visit occurred during the SARS-CoV-2 public health emergency.  Safety protocols were in place, including screening questions prior to the visit, additional usage of staff PPE, and extensive cleaning of exam room while observing appropriate contact time as indicated for disinfecting solutions.    Ricky Gregory , 1991-05-22, 29 y.o., male MRN: [id number] Patient Care Team    Relationship Specialty Notifications Start End  Ma Hillock, DO PCP - General Family Medicine  05/28/19     Chief Complaint  Patient presents with  . Foot Pain    Pt has foot pain, redness, and swelling Since Saturday. Pt did start new job and is on his feet all day. Denies injury,      Subjective: Pt presents for an OV with complaints of swelling of his right lower extremity.  Patient reports he started a new job last Monday in which she is on his feet more throughout the day.  By Wednesday he had noticed some left foot pain and discomfort which seemed to improve on its own.  On Saturday his right foot became swollen, painful and red.  He denies injury.  He denies fevers or chills.  He has been elevating, resting and icing his right foot.  He does not feel like it has improved.  He has restarted the diclofenac recently but has not been taking routinely.  He has had a positive ANA with rheumatological work-up in progress.  He does not have a history of gout that he is aware of however he has been complaining about intermittent right and left foot pain that seems to self resolve after starting NSAIDs over the last year.  Patient is unable to walk on his right foot secondary to pain today.  Depression screen PHQ 2/9 05/28/2019  Decreased Interest 0  Down, Depressed, Hopeless 0  PHQ - 2 Score 0    No Known Allergies Social History   Social History Narrative   Marital status/children/pets: Single.   Education/employment: Bachelor's degree.  Employed and works in Insurance underwriter.   Safety:      -Wears a bicycle  helmet riding a bike: Yes     -smoke alarm in the home:Yes     - wears seatbelt: Yes     - Feels safe in their relationships: Yes   Past Medical History:  Diagnosis Date  . Chicken pox   . Psoriasis    Past Surgical History:  Procedure Laterality Date  . APPENDECTOMY  2008  . WISDOM TOOTH EXTRACTION     Family History  Problem Relation Age of Onset  . Depression Mother   . Mental illness Mother   . Hypothyroidism Mother   . Iron deficiency Father   . Hypothyroidism Sister   . Immunodeficiency Sister        Common variable immune deficiency  . Heart attack Maternal Grandfather   . Alcohol abuse Paternal Grandfather   . Mental illness Brother    Allergies as of 08/19/2019   No Known Allergies     Medication List       Accurate as of August 19, 2019  5:27 PM. If you have any questions, ask your nurse or doctor.        diclofenac 75 MG EC tablet Commonly known as: VOLTAREN Take 1 tablet (75 mg total) by mouth 2 (two) times daily.   lisinopril 5 MG tablet Commonly known as: ZESTRIL Take 1 tablet (5 mg total) by mouth daily.   naproxen 500 MG tablet Commonly  known as: Naprosyn Take 1 tablet (500 mg total) by mouth 2 (two) times daily with a meal. Started by: Felix Pacini, DO   predniSONE 20 MG tablet Commonly known as: DELTASONE 60 mg x3d, 40 mg x3d, 20 mg x2d, 10 mg x2d Started by: Felix Pacini, DO   triamcinolone cream 0.1 % Commonly known as: KENALOG Apply 1 application topically 2 (two) times daily.       All past medical history, surgical history, allergies, family history, immunizations andmedications were updated in the EMR today and reviewed under the history and medication portions of their EMR.     ROS: Negative, with the exception of above mentioned in HPI   Objective:  BP 137/87 (BP Location: Left Arm, Patient Position: Sitting, Cuff Size: Large)   Pulse (!) 103   Temp 98.2 F (36.8 C) (Temporal)   Resp 18   Ht 6\' 2"  (1.88 m)   SpO2 96%    BMI 33.53 kg/m  Body mass index is 33.53 kg/m. Gen: Afebrile. No acute distress. Nontoxic in appearance, well developed, well nourished.  HENT: AT. [place].  Eyes:Pupils Equal Round Reactive to light, Extraocular movements intact,  Conjunctiva without redness, discharge or icterus. CV: RRR  Chest: CTAB, no wheeze or crackles.  MSK: Erythema present right medial ankle extending to right forefoot.  Mild-moderate tenderness to palpation over joint line and swelling.  No pitting edema.  Swelling of first metatarsal present.  Decreased range of motion of ankle secondary to pain.  Full range of motion of toes without much discomfort.  Pain with weightbearing.  Negative Homans.  Neurovascular intact distally with good cap refill. Neuro: In wheelchair secondary to pain with weightbearing.  PERLA. EOMi. Alert. Oriented x3    No exam data present Venous Img Lower Unilateral Right  Result Date: 08/19/2019 CLINICAL DATA:  Right lower extremity pain and swelling. EXAM: RIGHT LOWER EXTREMITY VENOUS DOPPLER ULTRASOUND TECHNIQUE: Gray-scale sonography with graded compression, as well as color Doppler and duplex ultrasound were performed to evaluate the lower extremity deep venous systems from the level of the common femoral vein and including the common femoral, femoral, profunda femoral, popliteal and calf veins including the posterior tibial, peroneal and gastrocnemius veins when visible. The superficial great saphenous vein was also interrogated. Spectral Doppler was utilized to evaluate flow at rest and with distal augmentation maneuvers in the common femoral, femoral and popliteal veins. COMPARISON:  None. FINDINGS: Contralateral Common Femoral Vein: Respiratory phasicity is normal and symmetric with the symptomatic side. No evidence of thrombus. Normal compressibility. Common Femoral Vein: No evidence of thrombus. Normal compressibility, respiratory phasicity and response to augmentation. Saphenofemoral  Junction: No evidence of thrombus. Normal compressibility and flow on color Doppler imaging. Profunda Femoral Vein: No evidence of thrombus. Normal compressibility and flow on color Doppler imaging. Femoral Vein: No evidence of thrombus. Normal compressibility, respiratory phasicity and response to augmentation. Popliteal Vein: No evidence of thrombus. Normal compressibility, respiratory phasicity and response to augmentation. Calf Veins: No evidence of thrombus. Normal compressibility and flow on color Doppler imaging. Superficial Great Saphenous Vein: No evidence of thrombus. Normal compressibility. Venous Reflux:  None. Other Findings:  None. IMPRESSION: No evidence of deep venous thrombosis. Electronically Signed   By: 08/21/2019 M.D.   On: 08/19/2019 16:36   No results found for this or any previous visit (from the past 24 hour(s)).  Assessment/Plan: Ricky Gregory is a 29 y.o. male present for OV for  Acute pain of right lower extremity/tachycardia/swelling lower extremity  Rule out DVT with venous ultrasound.  Area of patchy erythema on the medial aspect of his right foot may be secondary to his use of ice.  Given onset of symptoms and presentation cannot rule out DVT therefore will obtain urgent imaging today.  Labs collected CBC, sed rate and uric acid levels. - US Venous Img Lower Unilateral Right; Future - CBC w/Diff - Sedimentation rate - Uric acid - methylPREDNISolone acetate (DEPO-MEDROL) injection 80 mg -IM Depo-Medrol injection provided today.  Patient instructed to start prednisone taper tomorrow. Discontinue diclofenac for now.  Start naproxen 500 mg twice daily with food for discomfort. Once imaging study and lab results have returned we will consider further evaluation at that time.  If uric acid levels were not elevated would pursue a further rheumatological eval. Follow-up 1 week   Reviewed expectations re: course of current medical issues.  Discussed self-management  of symptoms.  Outlined signs and symptoms indicating need for more acute intervention.  Patient verbalized understanding and all questions were answered.  Patient received an After-Visit Summary.    Orders Placed This Encounter  Procedures  . US Venous Img Lower Unilateral Right  . CBC w/Diff  . Sedimentation rate  . Uric acid   Meds ordered this encounter  Medications  . naproxen (NAPROSYN) 500 MG tablet    Sig: Take 1 tablet (500 mg total) by mouth 2 (two) times daily with a meal.    Dispense:  14 tablet    Refill:  0  . predniSONE (DELTASONE) 20 MG tablet    Sig: 60 mg x3d, 40 mg x3d, 20 mg x2d, 10 mg x2d    Dispense:  18 tablet    Refill:  0  . methylPREDNISolone acetate (DEPO-MEDROL) injection 80 mg   Referral Orders  No referral(s) requested today     Note is dictated utilizing voice recognition software. Although note has been proof read prior to signing, occasional typographical errors still can be missed. If any questions arise, please do not hesitate to call for verification.   electronically signed by:  Felix Pacini, DO  [facility] [place]

## 2019-08-19 NOTE — Patient Instructions (Signed)
Start steroid pills tomorrow. Injection provided today.  Start naproxen every 12 hours with food for 10 days.  Keep foot elevated.  We will call you with lab and image results.   Follow up 1 week.   If shortness of breath or chest pain occurs please be seen immediatly.

## 2019-08-19 NOTE — Telephone Encounter (Signed)
Patient scheduled for 1:00 today.

## 2019-08-19 NOTE — Telephone Encounter (Signed)
Pt was called and scheduled for 1pm today with PCP

## 2019-08-20 ENCOUNTER — Telehealth: Payer: Self-pay | Admitting: Family Medicine

## 2019-08-20 LAB — URIC ACID: Uric Acid, Serum: 7.7 mg/dL (ref 4.0–8.0)

## 2019-08-20 LAB — CBC WITH DIFFERENTIAL/PLATELET
Absolute Monocytes: 858 cells/uL (ref 200–950)
Basophils Absolute: 58 cells/uL (ref 0–200)
Basophils Relative: 0.5 %
Eosinophils Absolute: 70 cells/uL (ref 15–500)
Eosinophils Relative: 0.6 %
HCT: 41.5 % (ref 38.5–50.0)
Hemoglobin: 14.6 g/dL (ref 13.2–17.1)
Lymphs Abs: 1508 cells/uL (ref 850–3900)
MCH: 31 pg (ref 27.0–33.0)
MCHC: 35.2 g/dL (ref 32.0–36.0)
MCV: 88.1 fL (ref 80.0–100.0)
MPV: 10.1 fL (ref 7.5–12.5)
Monocytes Relative: 7.4 %
Neutro Abs: 9106 cells/uL — ABNORMAL HIGH (ref 1500–7800)
Neutrophils Relative %: 78.5 %
Platelets: 312 10*3/uL (ref 140–400)
RBC: 4.71 10*6/uL (ref 4.20–5.80)
RDW: 12.8 % (ref 11.0–15.0)
Total Lymphocyte: 13 %
WBC: 11.6 10*3/uL — ABNORMAL HIGH (ref 3.8–10.8)

## 2019-08-20 LAB — SEDIMENTATION RATE: Sed Rate: 22 mm/h — ABNORMAL HIGH (ref 0–15)

## 2019-08-20 MED ORDER — DOXYCYCLINE HYCLATE 100 MG PO TABS: 100.0000 mg | ORAL_TABLET | Freq: Two times a day (BID) | ORAL | 0 refills | Status: DC

## 2019-08-20 NOTE — Telephone Encounter (Signed)
Please inform pt his inflammatory marker is mildly elevated.  Gout labs are normal>> no gout.  Doppler US> negative for blood clot.  He has a mild increase in white count that is more likely from his inflammation than infection, but to be cautious I have called in a short course of abx for him to take and I would like to see him Thursday for follow up if possible (Tuesday if he is unable)  At that visit I want to check for improvement and now that we have more evidence of inflammatory disorder>> I would like to move forward with Rheumatology work up with labs, as potential cause.

## 2019-08-20 NOTE — Telephone Encounter (Signed)
Pt was called and given information, he verbalized understanding and will bring bottle with him to appt to verify.

## 2019-08-20 NOTE — Telephone Encounter (Signed)
Sig was written as: 60 mg x3d, 40 mg x3d, 20 mg x2d, 10 mg x2d  Make sure he is reading label correctly and realizes pills are 20 mg each.  Please advise him to skip tomorrow's dose. Then restart Thursday with 60 mg one day, then rest of script as written per above in bold. He may also want to consider taking benadryl 25-50 mg before bed tonight>> he very well may have difficulty sleeping with that dose pf prednisone in his system.   Please have him bring the prednisone bottle with him to his appt.

## 2019-08-20 NOTE — Telephone Encounter (Signed)
Pt was called and given results/instructions. Pt was scheduled Thursday for F/U appt.   Pt took 6 tablets this morning of prednisone (20mg ) and states that is what the bottle reads. Reviewed instructions with patient on how it is supposed to be taken. Pt advised I would send message to Dr to review and he will not take anymore prednisone until he is advised on instructions.   **Will advise patient on call back to bring prednisone bottle with him to appt so we can review and call pharmacy if this was filled incorrectly.

## 2019-08-22 ENCOUNTER — Other Ambulatory Visit: Payer: Self-pay

## 2019-08-22 ENCOUNTER — Encounter: Payer: Self-pay | Admitting: Family Medicine

## 2019-08-22 ENCOUNTER — Ambulatory Visit: Payer: 59 | Admitting: Family Medicine

## 2019-08-22 VITALS — BP 134/84 | HR 98 | Temp 98.0°F | Resp 18 | Ht 74.0 in | Wt 270.0 lb

## 2019-08-22 DIAGNOSIS — M79675 Pain in left toe(s): Secondary | ICD-10-CM | POA: Diagnosis not present

## 2019-08-22 DIAGNOSIS — M25473 Effusion, unspecified ankle: Secondary | ICD-10-CM | POA: Diagnosis not present

## 2019-08-22 DIAGNOSIS — R238 Other skin changes: Secondary | ICD-10-CM

## 2019-08-22 DIAGNOSIS — M79672 Pain in left foot: Secondary | ICD-10-CM

## 2019-08-22 NOTE — Patient Instructions (Addendum)
Continue the prednisone per instruction 40 mg x3d, 20 mg x2d, 10 mg x2d  We will call you with lab results and likely will need to get you to a rheumatologist. I do feel this is an arthritis and Likely psoriatic arthritis.    Psoriatic Arthritis Psoriatic arthritis is a long-term (chronic) condition that causes pain, swelling, and stiffness in the joints. The large joints of the legs, hips, and pelvis are most often affected. The joints in the neck and back may also be affected. Sometimes psoriatic arthritis can involve joints in the toes, fingers, wrists, and elbows. Most people with psoriatic arthritis have a chronic skin disease that causes itchy scales and patches to form on the skin (psoriasis) before they develop psoriatic arthritis. In some cases, a person may have psoriatic arthritis before or without having psoriasis. Psoriatic arthritis can be mild or severe. It may come and go or cause symptoms all the time. It may affect one joint, a few joints, or many joints. In severe cases, untreated psoriatic arthritis can cause joint damage. What are the causes? The exact cause of this condition is not known. Psoriatic arthritis is an autoimmune disease. With this type of disease, the body's defense system (immune system) mistakenly attacks healthy tissues. If you have psoriasis, the immune system attacks the skin. With psoriatic arthritis, the immune system attacks joints and the tissues that connect muscles to joints (tendons). The disease may be activated by a trigger, such as an infection or stress. What increases the risk? You are more likely to develop this condition if:  You have psoriasis. This is the biggest risk factor. Most people who develop psoriatic arthritis have had psoriasis for 5 to 10 years.  You have a family history of psoriasis or psoriatic arthritis.  You are between the ages of 2 and 29. What are the signs or symptoms? The main symptom of this condition is inflammation  of joints and tendons. Other symptoms may include:  Joint swelling.  Joint pain.  Joint stiffness, especially in the morning.  Swollen fingers and toes.  Pain in areas where tendons connect to bones.  Pain in the heel or sole of the foot.  Pitted and weak nails.  Tiredness (fatigue).  Eye redness. How is this diagnosed? This condition may be diagnosed based on:  Your symptoms and medical history.  A physical exam.  X-rays to look for joint inflammation or damage, especially in the joints of the pelvis (sacroiliac joints).  Other imaging tests, such as a CT scan or MRI.  Blood tests to look for inflammation and to rule out other causes of joint inflammation, such as gout or rheumatoid arthritis. How is this treated? The goal of treatment is to reduce pain and inflammation and protect joints from damage. Treatment depends on how severe the inflammation is and how many joints are affected. Treatment may include:  Medicines, such as: ? NSAIDs to relieve pain and inflammation. For people with mild disease, these may be the only medicines needed. ? Disease-modifying antirheumatic drugs (DMARDs). ? Biologic medicines. These may be used for severe inflammation or if other medicines are not working. These medicines are usually given as injections or through an IV. They are very effective for many people with inflammatory arthritis, but there is an increased risk of infection.  Physical therapy and other exercise to strengthen muscles that support joints and to prevent joint stiffness.  A brace or splint to support a painful and swollen joint.  Surgery to reconstruct  or replace a joint. This may be an option for people with severe joint damage if other treatments have not helped. Follow these instructions at home: Medicines  Take over-the-counter and prescription medicines only as told by your health care provider.  Be aware of the possible side effects of your medicine and  know when to call your health care provider.  If you are taking a biologic, let your health care provider know if you have signs or symptoms of an infection. You may need to stop treatment until your infection clears up. Managing pain, stiffness, and swelling   If directed, put ice on painful areas. ? Put ice in a plastic bag. ? Place a towel between your skin and the bag. ? Leave the ice on for 20 minutes, 2-3 times a day. If you have a brace or splint:  Wear the brace or splint as told by your health care provider. Remove it only as told by your health care provider.  Loosen the brace or splint if your fingers or toes tingle, become numb, or turn cold and blue.  Keep the brace or splint clean.  If the brace or splint is not waterproof: ? Do not let it get wet. ? Cover it with a watertight covering when you take a bath or shower. Activity  Return to your normal activities as told by your health care provider. Ask your health care provider what activities are safe for you.  Get regular exercise. Ask your health care provider what type of exercise is best for you. Your health care provider may recommend: ? Low-impact exercises such as walking, biking, or swimming. ? Exercises that include stretching, such as tai chi and yoga.  Do not exercise when you have a flare of symptoms. Rest until the symptoms improve. Eating and drinking   Do not drink alcohol if you are taking an NSAID. Alcohol and NSAIDs can cause stomach irritation.  Eat a healthy diet that includes plenty of vegetables, fruits, whole grains, low-fat dairy products, and lean protein. Do not eat a lot of foods that are high in solid fats, added sugars, or salt. General instructions  Do not use any products that contain nicotine or tobacco, such as cigarettes, e-cigarettes, and chewing tobacco. These can make arthritis worse. If you need help quitting, ask your health care provider.  Maintain a healthy weight. A  healthy weight will help you stay active and take stress off your joints.  Stay up to date on all immunizations, including the yearly (annual) flu vaccine.  Keep all follow-up visits as told by your health care provider. This is important. Where to find more information  American Academy of Dermatology: http://jones-macias.info/  American College of Rheumatology: www.rheumatology.[place]: www.psoriasis.org Contact a health care provider if:  Your signs and symptoms flare up.  You have side effects from your medicines.  You are taking a biologic and you have a fever or other signs of infection, such as: ? Chills. ? Feeling tired. ? Cough or sore throat. ? Loss of appetite. Summary  Psoriatic arthritis is an autoimmune disease that causes joint pain, swelling, and stiffness.  Most people with psoriatic arthritis have the skin disease called psoriasis first.  You may have imaging tests and blood tests to help your health care provider diagnose this condition.  The goal of treatment is to reduce pain and inflammation and protect joints from damage.  Medicines can relieve symptoms and prevent joint damage. This information is not  intended to replace advice given to you by your health care provider. Make sure you discuss any questions you have with your health care provider. Document Revised: 09/04/2018 Document Reviewed: 01/11/2018 Elsevier Patient Education  2020 [employer].

## 2019-08-22 NOTE — Progress Notes (Signed)
   This visit occurred during the SARS-CoV-2 public health emergency.  Safety protocols were in place, including screening questions prior to the visit, additional usage of staff PPE, and extensive cleaning of exam room while observing appropriate contact time as indicated for disinfecting solutions.    Ricky Gregory , 08/30/1990, 28 y.o., male MRN: [id number] Patient Care Team    Relationship Specialty Notifications Start End  Kuneff, Renee A, DO PCP - General Family Medicine  05/28/19     Chief Complaint  Patient presents with  . Foot Pain    Right lower foot pain, better     Subjective: Ricky Gregory is a 28 y.o. male present for follow up on foot pain.  Patient reports he noticed improvement almost immediately after steroid injection.  He states within 2 days the pain and swelling was drastically better and today it is approximately 90% resolved.  Patient reports he has experienced swelling and pain that can occur in either bilateral foot or large toe intermittently for couple years.  Typically if he experiences a flare will last approximately 5 to 7 days and self resolve.  He has found nothing that seems to precipitate his flares.  He does endorse the one he experienced this week has been the worst it has ever been.  Labs showed very mildly elevated white count 11.6, with elevated neutrophils.  Mildly elevated sed rate at 22.  Uric acid in normal range. Patient does have a history of presumed psoriasis that occurs on his abdomen.  He has been referred to dermatology for this condition but admits he has not been able to establish as of yet.  He has had a weakly positive ANA 1: 80, nuclear speckled pattern.  Negative double-stranded DNA antibodies, negative smooth muscle antibodies, negative Ro antibody, negative Lyme antibody, negative scleroderma antibody, negative SM/RNP. Prior note:  an OV with complaints of swelling of his right lower extremity.  Patient reports he started a new job  last Monday in which he is on his feet more throughout the day.  By Wednesday he had noticed some left foot pain and discomfort which seemed to improve on its own.  On Saturday his right foot became swollen, painful and red.  He denies injury.  He denies fevers or chills.  He has been elevating, resting and icing his right foot.  He does not feel like it has improved.  He has restarted the diclofenac recently but has not been taking routinely.  He has had a positive ANA with rheumatological work-up in progress.  He does not have a history of gout that he is aware of however he has been complaining about intermittent right and left foot pain that seems to self resolve after starting NSAIDs over the last year.  Patient is unable to walk on his right foot secondary to pain today.  Depression screen PHQ 2/9 05/28/2019  Decreased Interest 0  Down, Depressed, Hopeless 0  PHQ - 2 Score 0    No Known Allergies Social History   Social History Narrative   Marital status/children/pets: Single.   Education/employment: Bachelor's degree.  Employed and works in insurance.   Safety:      -Wears a bicycle helmet riding a bike: Yes     -smoke alarm in the home:Yes     - wears seatbelt: Yes     - Feels safe in their relationships: Yes   Past Medical History:  Diagnosis Date  . Chicken pox   . Psoriasis      Past Surgical History:  Procedure Laterality Date  . APPENDECTOMY  2008  . WISDOM TOOTH EXTRACTION     Family History  Problem Relation Age of Onset  . Depression Mother   . Mental illness Mother   . Hypothyroidism Mother   . Iron deficiency Father   . Hypothyroidism Sister   . Immunodeficiency Sister        Common variable immune deficiency  . Heart attack Maternal Grandfather   . Alcohol abuse Paternal Grandfather   . Mental illness Brother    Allergies as of 08/22/2019   No Known Allergies     Medication List       Accurate as of August 22, 2019  1:35 PM. If you have any questions, ask  your nurse or doctor.        diclofenac 75 MG EC tablet Commonly known as: VOLTAREN Take 1 tablet (75 mg total) by mouth 2 (two) times daily.   doxycycline 100 MG tablet Commonly known as: VIBRA-TABS Take 1 tablet (100 mg total) by mouth 2 (two) times daily.   lisinopril 5 MG tablet Commonly known as: ZESTRIL Take 1 tablet (5 mg total) by mouth daily.   naproxen 500 MG tablet Commonly known as: Naprosyn Take 1 tablet (500 mg total) by mouth 2 (two) times daily with a meal.   predniSONE 20 MG tablet Commonly known as: DELTASONE 60 mg x3d, 40 mg x3d, 20 mg x2d, 10 mg x2d   triamcinolone cream 0.1 % Commonly known as: KENALOG Apply 1 application topically 2 (two) times daily.       All past medical history, surgical history, allergies, family history, immunizations andmedications were updated in the EMR today and reviewed under the history and medication portions of their EMR.     ROS: Negative, with the exception of above mentioned in HPI   Objective:  BP 134/84 (BP Location: Right Arm, Patient Position: Sitting, Cuff Size: Large)   Pulse 98   Temp 98 F (36.7 C) (Temporal)   Resp 18   Ht 6' 2" (1.88 m)   Wt 270 lb (122.5 kg)   SpO2 97%   BMI 34.67 kg/m  Body mass index is 34.67 kg/m. Gen: Afebrile. No acute distress.  HENT: AT. [place].  Eyes:Pupils Equal Round Reactive to light, Extraocular movements intact,  Conjunctiva without redness, discharge or icterus. MSK: Very mild erythema remains surrounding medial left ankle.  Mild swelling remains of foot and large toe, but much improved.  Neuro:  Normal gait. PERLA. EOMi. Alert. Oriented.  Psych: Normal affect, dress and demeanor. Normal speech. Normal thought content and judgment.  No exam data present No results found. No results found for this or any previous visit (from the past 24 hour(s)).  Assessment/Plan: Ricky Gregory is a 29 y.o. male present for OV for  Pain of toe of left foot/pain of left  foot/redness and swelling of ankle Suspect patient has psoriatic arthritis.  Gout and DVT were ruled out.  She responded to steroid initiation almost immediately.  He did have mildly elevated inflammatory marker.  He has known skin conditions suspected to be psoriasis.  His large toe appeared to be consistent with dactylitis on exam initially.  It is much improved today.  Discussed moving forward with arthritic work-up for him and he is agreeable to initiate that today.  He is aware if positive would refer to rheumatology to consider medication options to treat his psoriatic arthritis.  He is agreeable to this today. Complete steroid  taper - Rheumatoid Factor - Cyclic citrul peptide antibody, IgG ([facility]) - HLA-B27 antigen Follow-up as needed   Reviewed expectations re: course of current medical issues.  Discussed self-management of symptoms.  Outlined signs and symptoms indicating need for more acute intervention.  Patient verbalized understanding and all questions were answered.  Patient received an After-Visit Summary.    Orders Placed This Encounter  Procedures  . Rheumatoid Factor  . Cyclic citrul peptide antibody, IgG ([facility])  . HLA-B27 antigen   No orders of the defined types were placed in this encounter.  Referral Orders  No referral(s) requested today     Note is dictated utilizing voice recognition software. Although note has been proof read prior to signing, occasional typographical errors still can be missed. If any questions arise, please do not hesitate to call for verification.   electronically signed by:  Howard Pouch, DO  [place]

## 2019-08-23 LAB — RHEUMATOID FACTOR: Rheumatoid fact SerPl-aCnc: <14 IU/mL (ref <14)

## 2019-08-23 LAB — HLA-B27 ANTIGEN: HLA-B27 Antigen: NEGATIVE

## 2019-08-23 LAB — CYCLIC CITRUL PEPTIDE ANTIBODY, IGG: Cyclic Citrullin Peptide Ab: <16 UNITS

## 2019-08-27 ENCOUNTER — Telehealth: Payer: Self-pay | Admitting: Family Medicine

## 2019-08-27 DIAGNOSIS — L409 Psoriasis, unspecified: Secondary | ICD-10-CM

## 2019-08-27 DIAGNOSIS — M1990 Unspecified osteoarthritis, unspecified site: Secondary | ICD-10-CM

## 2019-08-27 NOTE — Telephone Encounter (Signed)
Pt was called and VM was full

## 2019-08-27 NOTE — Telephone Encounter (Signed)
Please inform patient the following information: His rheumatoid factor, anti-CCP and HLA-B27 labs are all normal.  This still does not completely rule out psoriatic arthritis and I do believe that it is his diagnosis.  Therefore I have referred him to rheumatology for further evaluation.   He should be hearing from them to schedule shortly.

## 2019-08-28 NOTE — Telephone Encounter (Signed)
Tried to contact pt, no answer and was unable to leave a VM

## 2019-09-02 NOTE — Telephone Encounter (Signed)
Pt was mailed letter with results

## 2020-05-19 ENCOUNTER — Telehealth: Payer: Self-pay

## 2020-05-19 NOTE — Telephone Encounter (Signed)
RF request for triamcinolone 0.1% cream LOV:08/22/19 Next ov: n/a Last written:06/20/19 (80g,2)  Please advise if pt is needing OV to get refill. Instructed to f/u 2 weeks after 06/20/19

## 2020-05-26 NOTE — Addendum Note (Signed)
Addended by: Felix Pacini A on: 05/26/2020 03:30 PM   Modules accepted: Orders

## 2020-05-26 NOTE — Telephone Encounter (Signed)
Attempted to contact pt and could not lvm

## 2020-05-26 NOTE — Telephone Encounter (Signed)
I refilled steroid cream. He will need appt for additional refills.  It appears he is overdue for hypertension follow up appt also (every 6 mos).

## 2021-02-12 IMAGING — US US EXTREM LOW VENOUS*R*
1 series · 13 of 24 positions shown · non-contrast
Comparison: None.

CLINICAL DATA: Right lower extremity pain and swelling.



[Series 1: us extrem low venous*right* · 0.08mm/px · 13 of 42 slices shown]
[im 1/42]
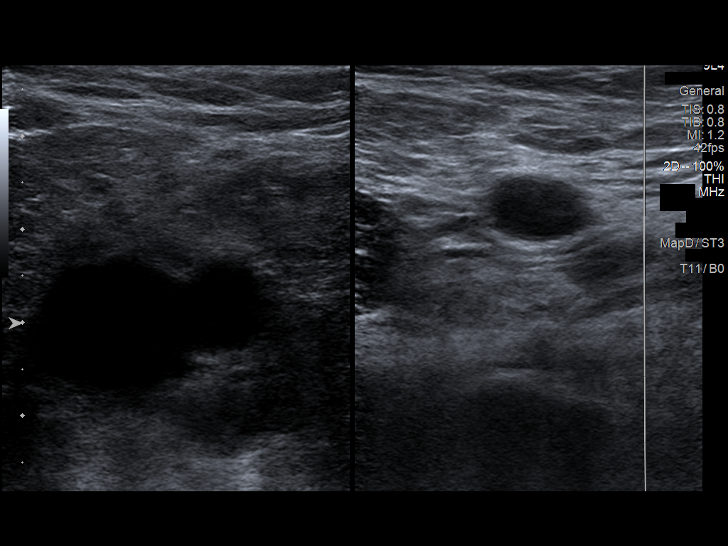
[im 4/42]
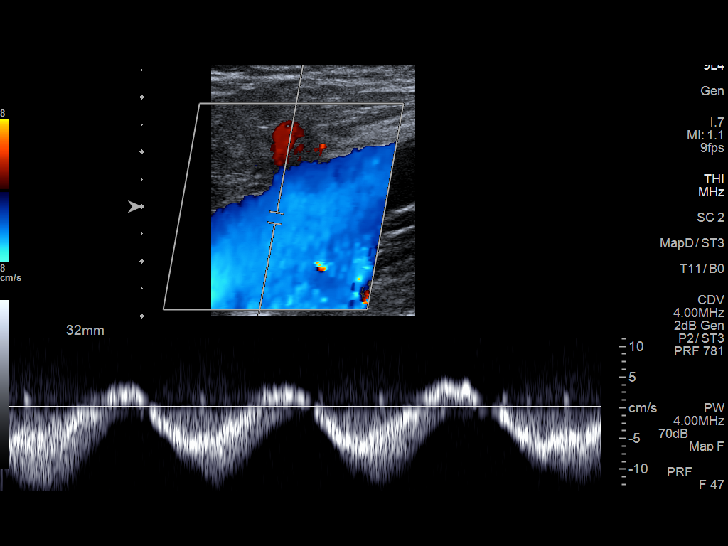
[im 8/42]
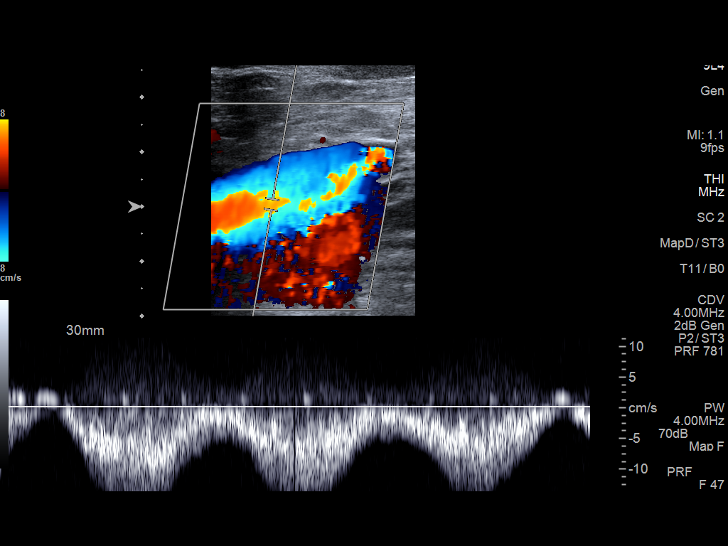
[im 11/42]
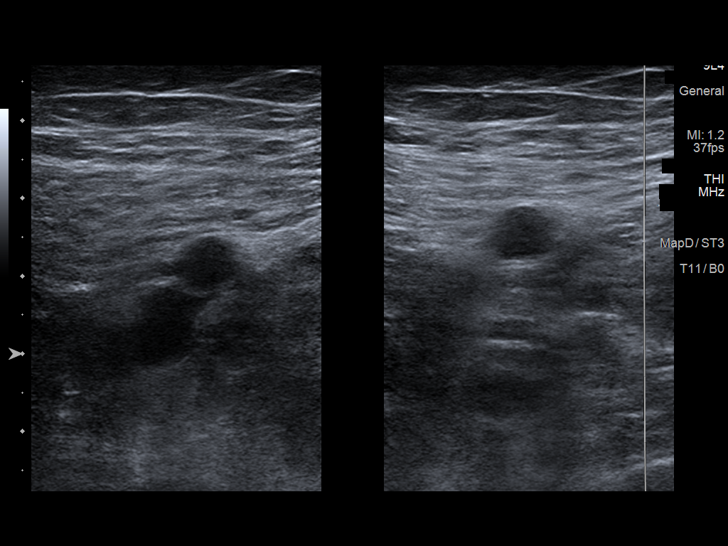
[im 15/42]
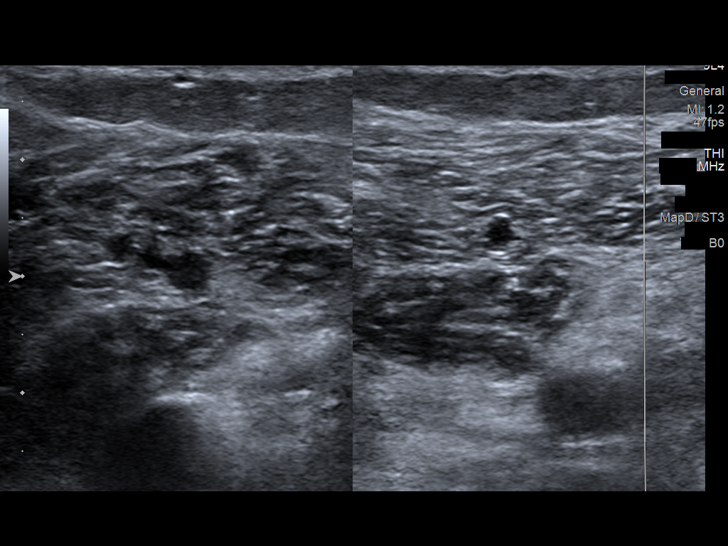
[im 18/42]
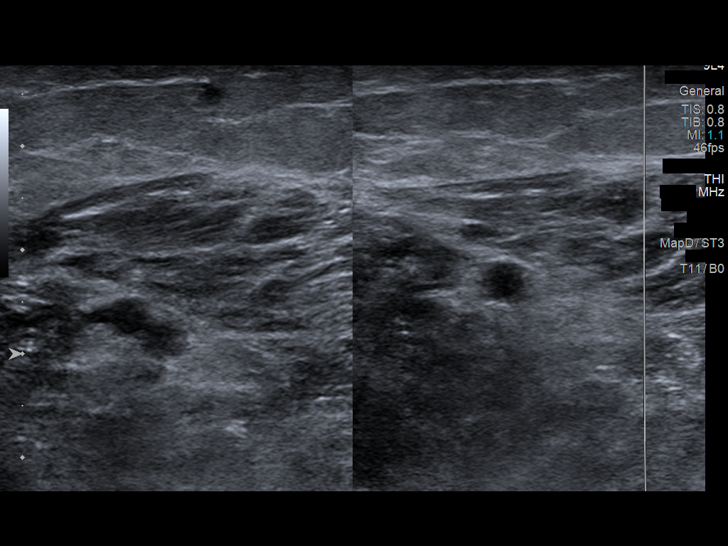
[im 22/42]
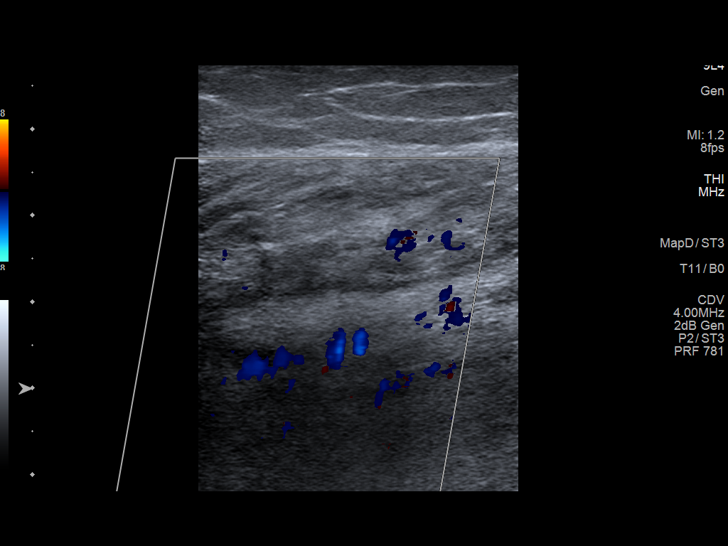
[im 24/42]
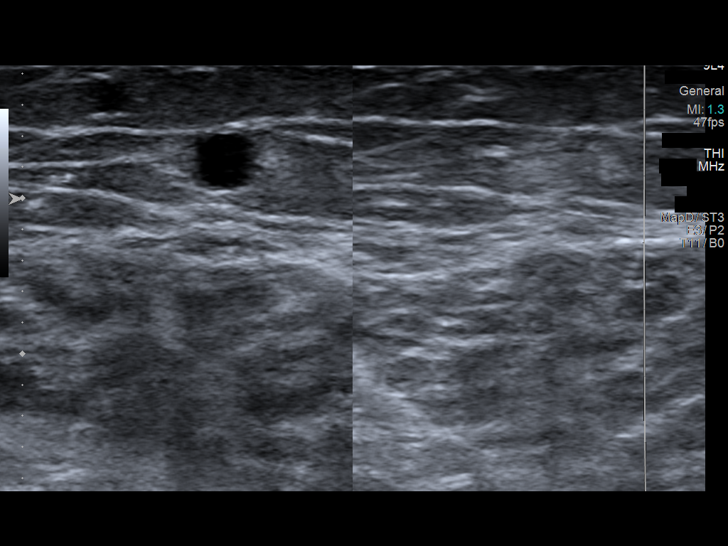
[im 27/42]
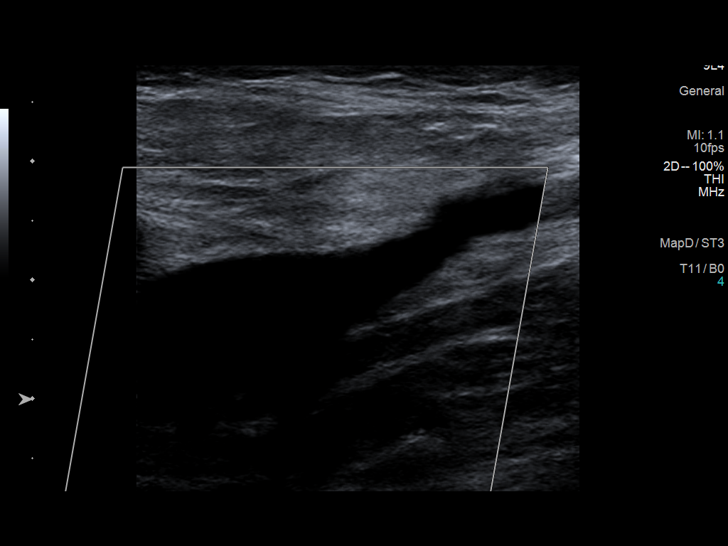
[im 31/42]
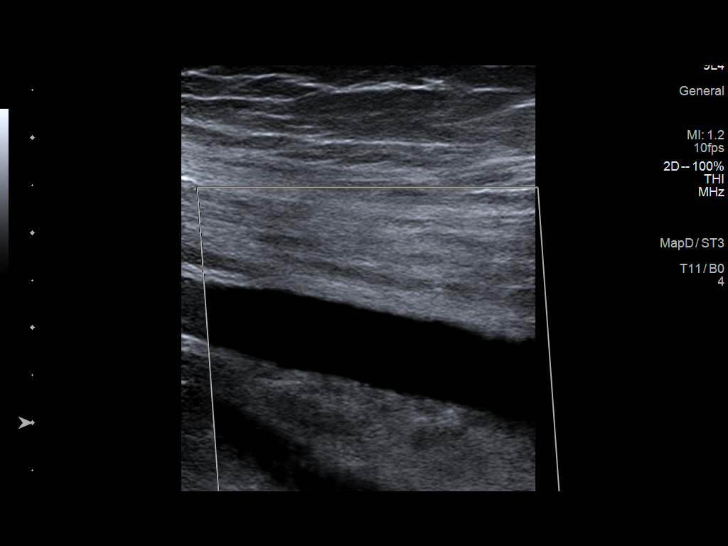
[im 34/42]
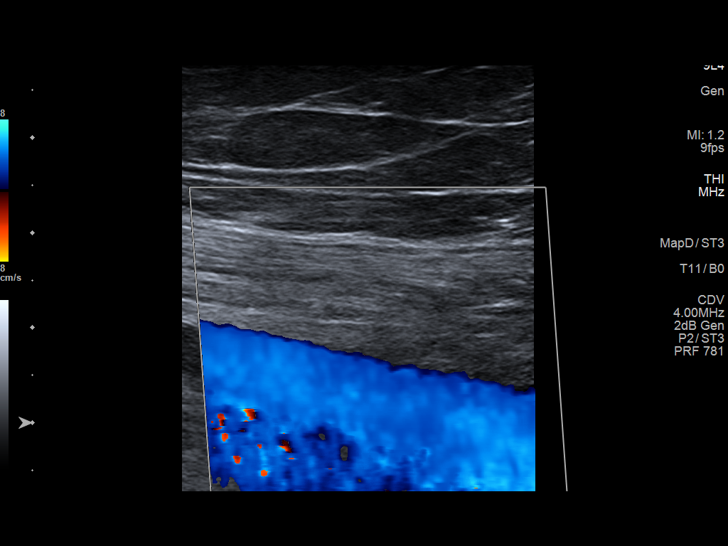
[im 38/42]
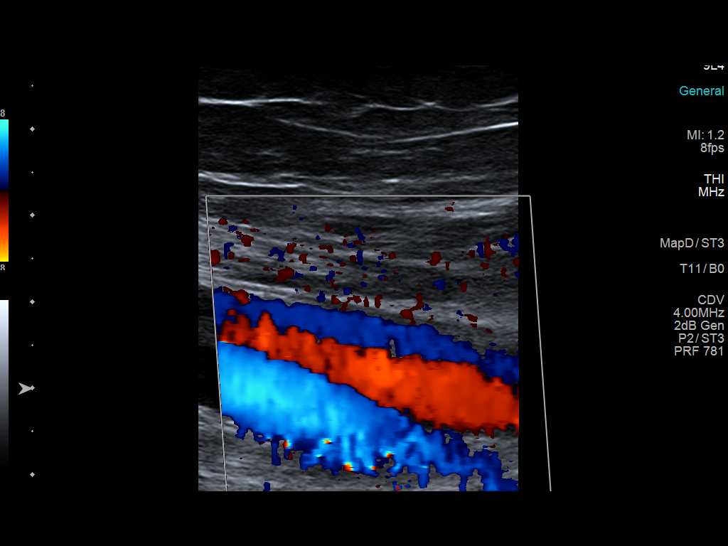
[im 42/42]
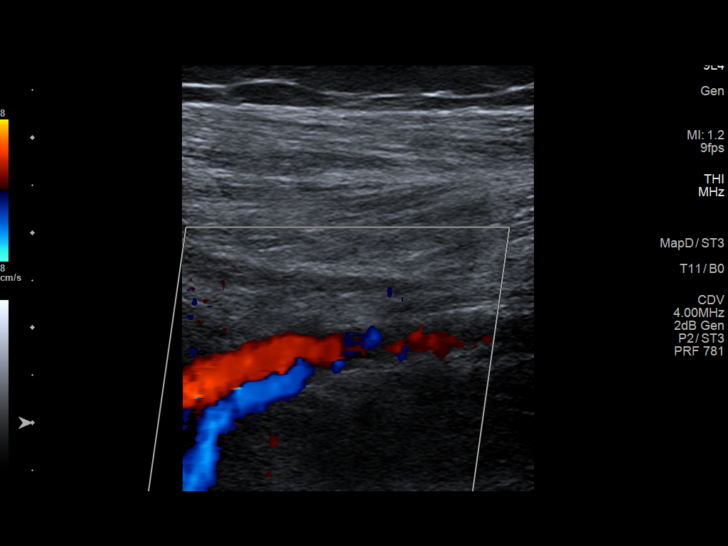

[13 of 24 positions shown; findings below may reference images not displayed]

FINDINGS: Contralateral Common Femoral Vein: Respiratory phasicity is normal
and symmetric with the symptomatic side. No evidence of thrombus.
Normal compressibility.

Common Femoral Vein: No evidence of thrombus. Normal
compressibility, respiratory phasicity and response to augmentation.

Saphenofemoral Junction: No evidence of thrombus. Normal
compressibility and flow on color Doppler imaging.

Profunda Femoral Vein: No evidence of thrombus. Normal
compressibility and flow on color Doppler imaging.

Femoral Vein: No evidence of thrombus. Normal compressibility,
respiratory phasicity and response to augmentation.

Popliteal Vein: No evidence of thrombus. Normal compressibility,
respiratory phasicity and response to augmentation.

Calf Veins: No evidence of thrombus. Normal compressibility and flow
on color Doppler imaging.

Superficial Great Saphenous Vein: No evidence of thrombus. Normal
compressibility.

Venous Reflux:  None.

Other Findings:  None.
IMPRESSION: No evidence of deep venous thrombosis.
